# Patient Record
Sex: Male | Born: 1940 | Race: White | Hispanic: No | Marital: Married | Smoking: Never smoker
Health system: Southern US, Community
[De-identification: ages and names within clinical notes are randomized; demographics above are authoritative.]

## PROBLEM LIST (undated history)

## (undated) DIAGNOSIS — N183 Chronic kidney disease, stage 3 unspecified: Secondary | ICD-10-CM

## (undated) DIAGNOSIS — J45909 Unspecified asthma, uncomplicated: Secondary | ICD-10-CM

## (undated) DIAGNOSIS — R7303 Prediabetes: Secondary | ICD-10-CM

## (undated) DIAGNOSIS — I10 Essential (primary) hypertension: Secondary | ICD-10-CM

## (undated) DIAGNOSIS — E119 Type 2 diabetes mellitus without complications: Secondary | ICD-10-CM

## (undated) DIAGNOSIS — Z972 Presence of dental prosthetic device (complete) (partial): Secondary | ICD-10-CM

## (undated) HISTORY — PX: CORONARY ARTERY BYPASS GRAFT: SHX141

---

## 2005-03-17 ENCOUNTER — Ambulatory Visit: Payer: Self-pay | Admitting: Unknown Physician Specialty

## 2011-09-08 HISTORY — PX: CORONARY ARTERY BYPASS GRAFT: SHX141

## 2013-10-18 ENCOUNTER — Ambulatory Visit: Payer: Self-pay | Admitting: Unknown Physician Specialty

## 2013-10-19 LAB — PATHOLOGY REPORT

## 2015-05-30 ENCOUNTER — Other Ambulatory Visit: Payer: Self-pay | Admitting: Neurology

## 2015-05-30 DIAGNOSIS — R51 Headache: Principal | ICD-10-CM

## 2015-05-30 DIAGNOSIS — R519 Headache, unspecified: Secondary | ICD-10-CM

## 2015-06-05 ENCOUNTER — Ambulatory Visit
Admission: RE | Admit: 2015-06-05 | Discharge: 2015-06-05 | Disposition: A | Discharge status: Home / Self Care | Payer: Medicare Other | Source: Ambulatory Visit | Attending: Neurology | Admitting: Neurology

## 2015-06-05 DIAGNOSIS — S0990XA Unspecified injury of head, initial encounter: Secondary | ICD-10-CM | POA: Insufficient documentation

## 2015-06-05 DIAGNOSIS — J320 Chronic maxillary sinusitis: Secondary | ICD-10-CM | POA: Diagnosis not present

## 2015-06-05 DIAGNOSIS — R51 Headache: Secondary | ICD-10-CM

## 2015-06-05 DIAGNOSIS — R519 Headache, unspecified: Secondary | ICD-10-CM

## 2017-04-07 ENCOUNTER — Ambulatory Visit: Payer: Medicare Other

## 2017-04-07 ENCOUNTER — Ambulatory Visit
Admission: EM | Admit: 2017-04-07 | Discharge: 2017-04-07 | Disposition: A | Discharge status: Home / Self Care | Payer: Medicare Other | Attending: Family Medicine | Admitting: Family Medicine

## 2017-04-07 DIAGNOSIS — S5001XA Contusion of right elbow, initial encounter: Secondary | ICD-10-CM | POA: Insufficient documentation

## 2017-04-07 DIAGNOSIS — Y9301 Activity, walking, marching and hiking: Secondary | ICD-10-CM | POA: Insufficient documentation

## 2017-04-07 DIAGNOSIS — W19XXXA Unspecified fall, initial encounter: Secondary | ICD-10-CM | POA: Diagnosis not present

## 2017-04-07 DIAGNOSIS — M25521 Pain in right elbow: Secondary | ICD-10-CM | POA: Diagnosis present

## 2017-04-07 DIAGNOSIS — W010XXA Fall on same level from slipping, tripping and stumbling without subsequent striking against object, initial encounter: Secondary | ICD-10-CM | POA: Insufficient documentation

## 2017-04-07 NOTE — ED Triage Notes (Signed)
Patient complains of right elbow pain that occurred when [place]. Patient states that he fell with his arms straight out. Patient has swelling and bruising on his arm.

## 2017-04-07 NOTE — ED Provider Notes (Signed)
CSN: [id number]     Arrival date & time 04/07/17  1413 History   First MD Initiated Contact with Patient 04/07/17 1454     Chief Complaint  Patient presents with  . Fall  . Elbow Pain    right   (Consider location/radiation/quality/duration/timing/severity/associated sxs/prior Treatment) HPI  This a 76 year old male who states today around 11:00 he was walking when he tripped over a ladder leaning forward. He states he was holding a gallon jug in his left hand and fell full force onto an outstretched right arm. He fell onto ground. He had pain in his elbow. He had no numbness or tingling distally. He states that they have hours with full flexion and also with pronation.       History reviewed. No pertinent past medical history. History reviewed. No pertinent surgical history. History reviewed. No pertinent family history. Social History  Substance Use Topics  . Smoking status: Never Smoker  . Smokeless tobacco: Never Used  . Alcohol use No    Review of Systems  Constitutional: Positive for activity change. Negative for chills, fatigue and fever.  Musculoskeletal: Positive for arthralgias and joint swelling.    Allergies  Patient has no known allergies.  Home Medications   Prior to Admission medications   Medication Sig Start Date End Date Taking? Authorizing Provider  aspirin EC 81 MG tablet Take 81 mg by mouth daily.   Yes [provider]  atorvastatin (LIPITOR) 20 MG tablet Take 20 mg by mouth daily.   Yes [provider]  cholecalciferol (VITAMIN D) 1000 units tablet Take 1,000 Units by mouth daily.   Yes [provider]  ipratropium-albuterol (DUONEB) 0.5-2.5 (3) MG/3ML SOLN Take 3 mLs by nebulization.   Yes [provider]  lisinopril (PRINIVIL,ZESTRIL) 20 MG tablet Take 20 mg by mouth daily.   Yes [provider]  metFORMIN (GLUCOPHAGE) 500 MG tablet Take by mouth 2 (two) times daily with a meal.   Yes [provider]  metoprolol succinate (TOPROL-XL) 50 MG 24 hr tablet Take 50 mg by mouth daily. Take with or immediately following a meal.   Yes [provider]  nitroGLYCERIN (NITROSTAT) 0.4 MG SL tablet Place 0.4 mg under the tongue every 5 (five) minutes as needed for chest pain.   Yes [provider]  Omega-3 Fatty Acids (FISH OIL) 1000 MG CAPS Take by mouth.   Yes [provider]  vitamin B-12 (CYANOCOBALAMIN) 1000 MCG tablet Take 1,000 mcg by mouth daily.   Yes [provider]   Meds Ordered and Administered this Visit  Medications - No data to display  BP (!) 105/50 (BP Location: Left Arm)   Pulse (!) 55   Temp 97.9 F (36.6 C) (Oral)   Resp 18   Ht 5\' 10"  (1.778 m)   Wt 205 lb (93 kg)   SpO2 99%   BMI 29.41 kg/m  No data found.   Physical Exam  Constitutional: He appears well-developed and well-nourished. No distress.  HENT:  Head: Normocephalic and atraumatic.  Eyes: Pupils are equal, round, and reactive to light.  Neck: Normal range of motion.  Musculoskeletal:  Examination of the right elbow shows ecchymosis and swelling just proximal to the elbow. There is tenderness to palpation of the distal humerus as well as the radial head. Sensation is intact to light touch distally. Muscle strength is intact.  Skin: He is not diaphoretic.  Nursing note and vitals reviewed.   Urgent Care Course  Procedures (including critical care time)  Labs Review Labs Reviewed - No data to display  Imaging Review Dg Elbow Complete Right  Result Date: 04/07/2017 CLINICAL DATA:  Status post fall with medial pain. EXAM: RIGHT ELBOW - COMPLETE 3+ VIEW COMPARISON:  None. FINDINGS: There is no evidence of fracture, dislocation, or joint effusion. There is no evidence of arthropathy or other focal bone abnormality. Olecranon enthesophyte. Productive changes about the elbow joint with small periarticular osteophytes. IMPRESSION: No acute fracture or  dislocation identified. No appreciable joint effusion. Electronically Signed   By: Mitzi HansenLance  Furusawa-Stratton M.D.   On: 04/07/2017 15:14     Visual Acuity Review  Right Eye Distance:   Left Eye Distance:   Bilateral Distance:    Right Eye Near:   Left Eye Near:    Bilateral Near:         MDM   1. Fall, initial encounter   2. Contusion of right elbow, initial encounter   Possible occult impacted radial head fracture New Prescriptions   No medications on file  Plan: 1. Test/x-ray results and diagnosis reviewed with patient 2. rx as per orders; risks, benefits, potential side effects reviewed with patient 3. Recommend supportive treatment with Elevation above heart most today and tomorrow. Use sling for comfort. Several times daily put arm through range of motion exercises. If not improving [facility] [place] [place]. 4. F/u prn if symptoms worsen or don't improve     Lutricia FeilRoemer, Aviva Wolfer P, PA-C 04/07/17 1551

## 2017-04-07 NOTE — Discharge Instructions (Signed)
Elevate your arm above your heart most of today and tomorrow. Apply ice to your elbow 20 minutes out of every 2 hours 4 times a day control swelling. Usually her sling for comfort. Several times daily take her arm out of the sling and use your arm through range of motion exercises. Follow-up with [facility] if you are not improving in a week.

## 2017-09-08 ENCOUNTER — Ambulatory Visit
Admission: EM | Admit: 2017-09-08 | Discharge: 2017-09-08 | Disposition: A | Discharge status: Home / Self Care | Payer: Medicare Other | Attending: Family Medicine | Admitting: Family Medicine

## 2017-09-08 DIAGNOSIS — T148XXA Other injury of unspecified body region, initial encounter: Secondary | ICD-10-CM | POA: Diagnosis not present

## 2017-09-08 NOTE — ED Triage Notes (Signed)
Patient reports that he had an ear biopsy done around 2-3 weeks ago by Dr. Cheree DittoGraham. Patient states that the area started bleeding around 30 minutes prior to arrival. Patient bleeding significantly with clots on clothes.

## 2017-09-08 NOTE — Discharge Instructions (Signed)
Follow up with Dermatology.  Leave dressing on for 24 hours.   Take care  Dr. Adriana Simasook

## 2017-09-08 NOTE — ED Provider Notes (Addendum)
[facility]    CSN: [id number] Arrival date & time: 09/08/17  1857  History   Chief Complaint Chief Complaint  Patient presents with  . Bleeding/Bruising    HPI  76 year old male presents urgently for a bleeding wound.  Patient reports that approximately 2 weeks ago he had a skin lesion removed by dermatology.  He was out today and suddenly started bleeding from the wound.  It is located behind the right ear.  He reports profuse bleeding.  He applied pressure and came in urgently for evaluation.  He reports that he takes aspirin on a daily basis due to coronary disease.  He has had no complications since his skin lesion removal.  He does wear glasses and this may be the inciting factor.  No other associated symptoms.  No other complaints or concerns at this time.  Home Medications    Prior to Admission medications   Medication Sig Start Date End Date Taking? Authorizing Provider  aspirin EC 81 MG tablet Take 81 mg by mouth daily.   Yes [provider]  atorvastatin (LIPITOR) 20 MG tablet Take 20 mg by mouth daily.   Yes [provider]  cholecalciferol (VITAMIN D) 1000 units tablet Take 1,000 Units by mouth daily.   Yes [provider]  ipratropium-albuterol (DUONEB) 0.5-2.5 (3) MG/3ML SOLN Take 3 mLs by nebulization.   Yes [provider]  lisinopril (PRINIVIL,ZESTRIL) 20 MG tablet Take 20 mg by mouth daily.   Yes [provider]  metFORMIN (GLUCOPHAGE) 500 MG tablet Take by mouth 2 (two) times daily with a meal.   Yes [provider]  metoprolol succinate (TOPROL-XL) 50 MG 24 hr tablet Take 50 mg by mouth daily. Take with or immediately following a meal.   Yes [provider]  nitroGLYCERIN (NITROSTAT) 0.4 MG SL tablet Place 0.4 mg under the tongue every 5 (five) minutes as needed for chest pain.   Yes [provider]  Omega-3 Fatty Acids (FISH OIL) 1000 MG CAPS Take by mouth.   Yes [provider]  vitamin B-12 (CYANOCOBALAMIN) 1000 MCG tablet Take 1,000 mcg by mouth daily.   Yes [provider]   Allergies   Patient has no known allergies.   Review of Systems Review of Systems  Constitutional: Negative.   Skin: Positive for wound.       Bleeding.   Physical Exam Triage Vital Signs ED Triage Vitals  Enc Vitals Group     BP 09/08/17 1918 (!) 144/75     Pulse Rate 09/08/17 1918 72     Resp 09/08/17 1918 17     Temp 09/08/17 1918 98.4 F (36.9 C)     Temp Source 09/08/17 1918 Oral     SpO2 09/08/17 1918 99 %     Weight 09/08/17 1913 197 lb (89.4 kg)     Height --      Head Circumference --      Peak Flow --      Pain Score 09/08/17 1913 0     Pain Loc --      Pain Edu? --      Excl. in GC? --    Updated Vital Signs BP (!) 144/75 (BP Location: Left Arm)   Pulse 72   Temp 98.4 F (36.9 C) (Oral)   Resp 17   Wt 197 lb (89.4 kg)   SpO2 99%   BMI 28.27 kg/m   Physical Exam  Constitutional: He is oriented to person,  place, and time. He appears well-developed. No distress.  Covered in blood.  HENT:  Head: Normocephalic and atraumatic.  Eyes: Conjunctivae are normal.  Pulmonary/Chest: Effort normal. No respiratory distress.  Neurological: He is alert and oriented to person, place, and time.  Skin:  Posterior auricular region - circular open wound from recent lesion removal.  Mild oozing noted after area was cleaned.  Psychiatric: He has a normal mood and affect.  Vitals reviewed.  UC Treatments / Results  Labs (all labs ordered are listed, but only abnormal results are displayed) Labs Reviewed - No data to display  EKG  EKG Interpretation None       Radiology No results found.  Procedures Procedures (including critical care time)\ Area cleansed with alcohol and anesthetized with 1% lidocaine without epinephrine (~ 2 mL).  Cautery was used to stop bleeding.  Dressing was then applied.  Patient tolerated procedure without  difficulty.  Medications Ordered in UC Medications - No data to display   Initial Impression / Assessment and Plan / UC Course  I have reviewed the triage vital signs and the nursing notes.  Pertinent labs & imaging results that were available during my care of the patient were reviewed by me and considered in my medical decision making (see chart for details).    76 year old male presents with a bleeding wound.  Bleeding was controlled with pressure.  Wound cleaned and explored.  Oozing noted from the wound.  Wound anesthetized with lidocaine and was cauterized.  Dressing then applied.  Advised the patient to leave the dressing on for 24 hours and then he can remove.  Family member to change dressings.  Follow-up with dermatology.  Final Clinical Impressions(s) / UC Diagnoses   Final diagnoses:  Bleeding from wound   New Prescriptions Discharge Medication List as of 09/08/2017  7:23 PM     Controlled Substance Prescriptions  Controlled Substance Registry consulted? NoTommie Sams.   Dima Mini G, DO 09/08/17 2012    Everlene Otherook, Rajat Staver G, DO 09/08/17 2015

## 2018-02-26 IMAGING — CR DG ELBOW COMPLETE 3+V*R*
6 series · 6 of 6 positions shown · non-contrast
Comparison: None.

CLINICAL DATA: Status post fall with medial pain.

EXAM:
RIGHT ELBOW - COMPLETE 3+ VIEW

[elbow ap]
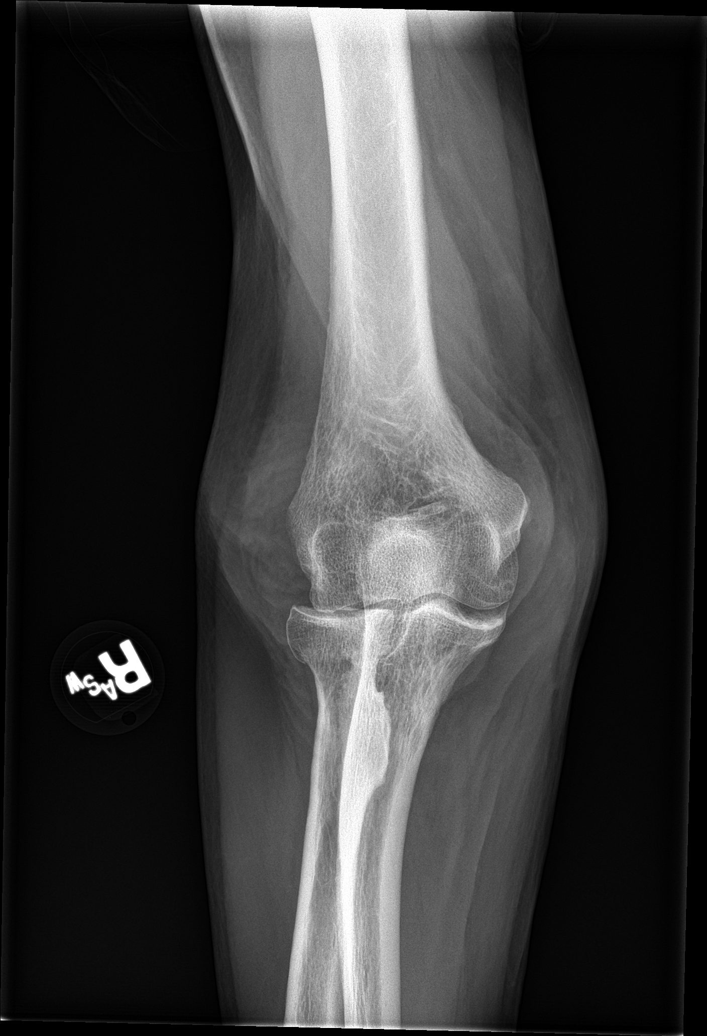

[elbow obl (1 of 4)]
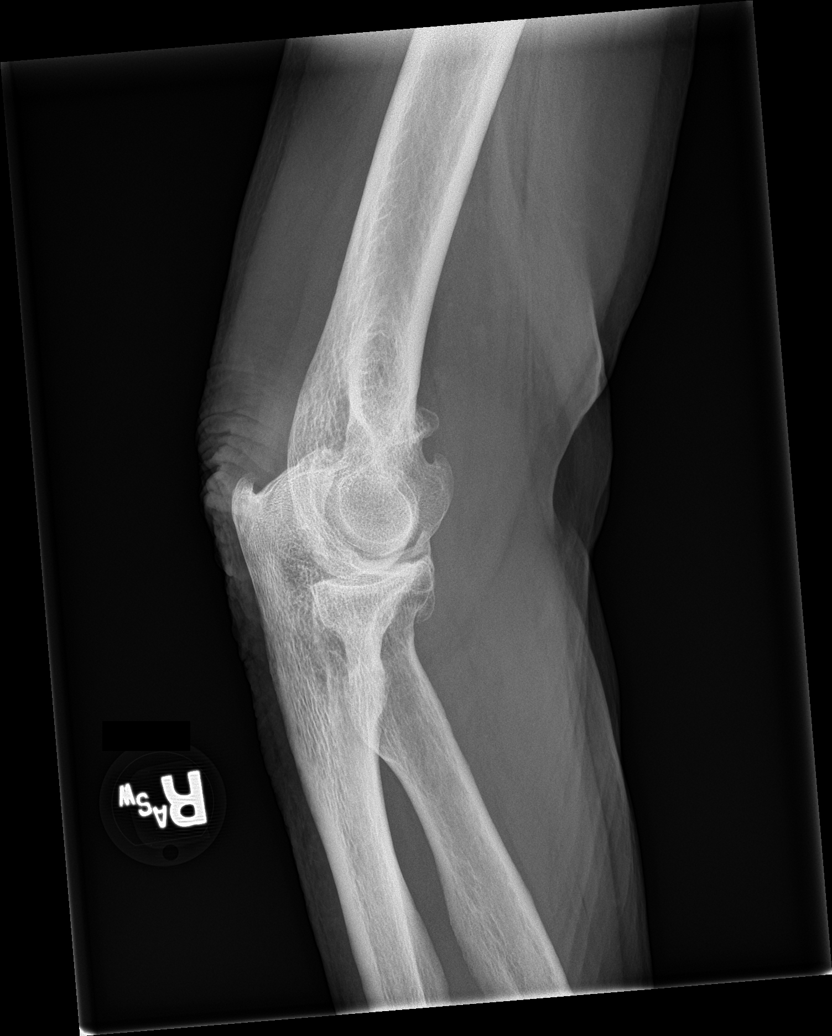

[elbow obl (2 of 4)]
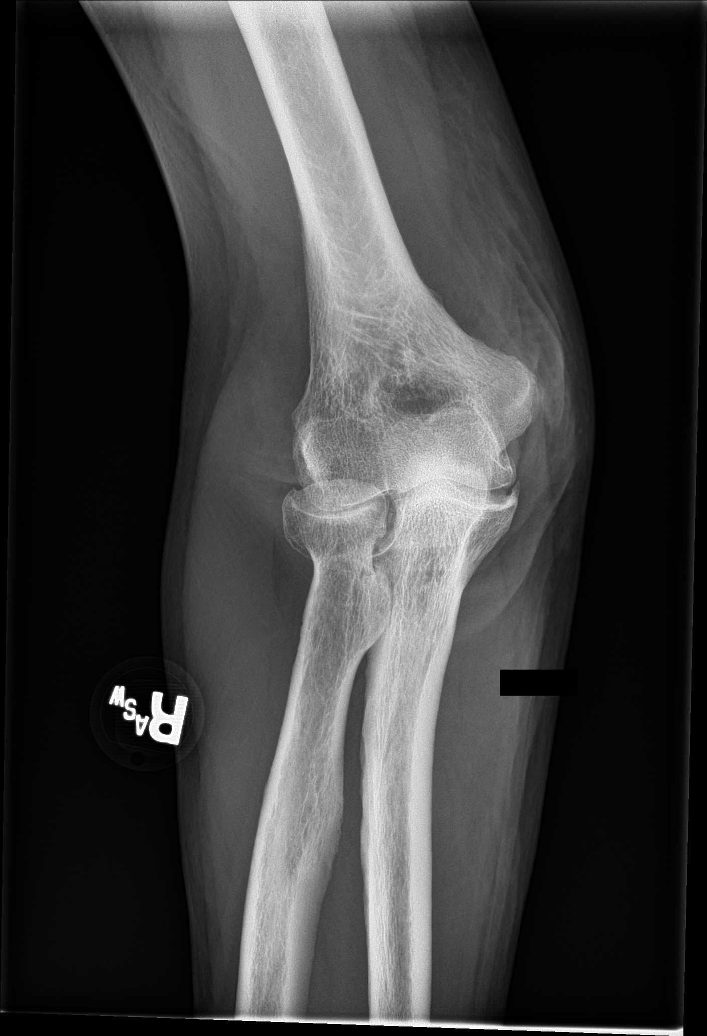

[elbow lat]
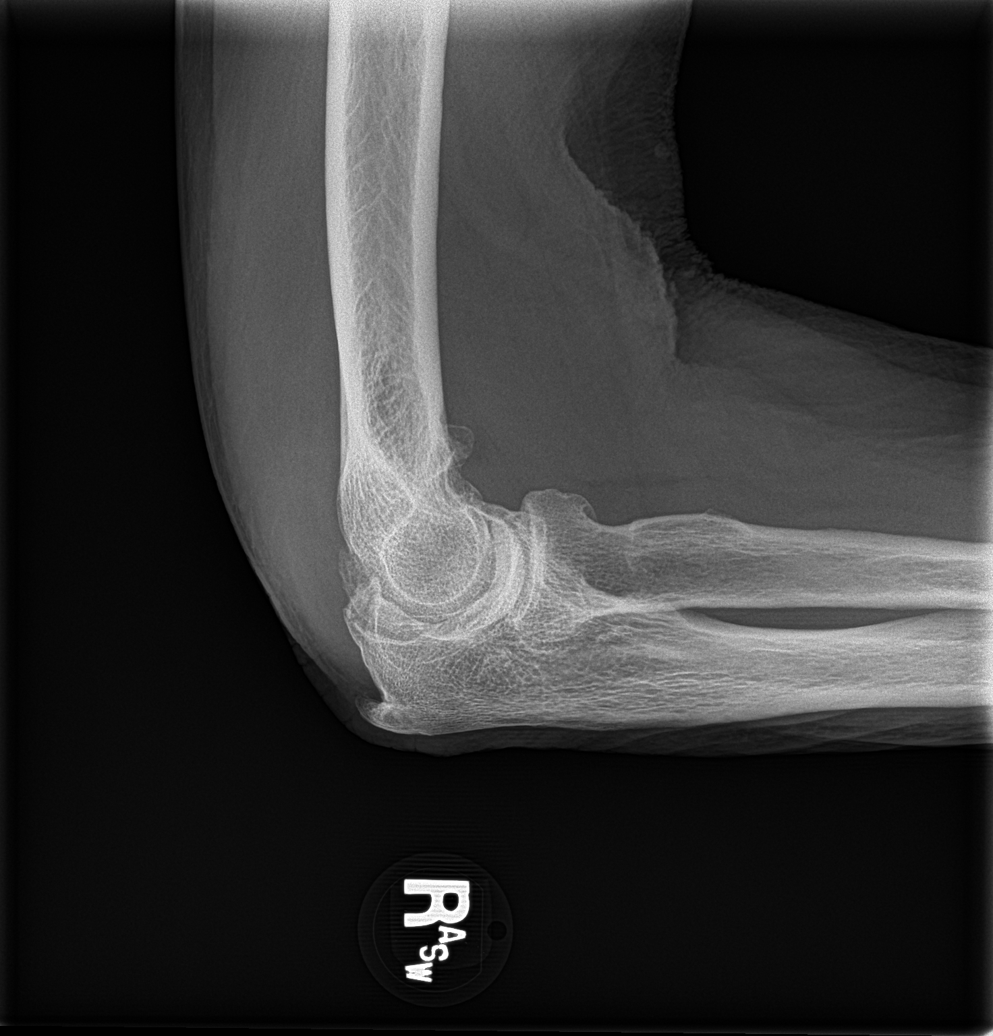

[elbow obl (3 of 4)]
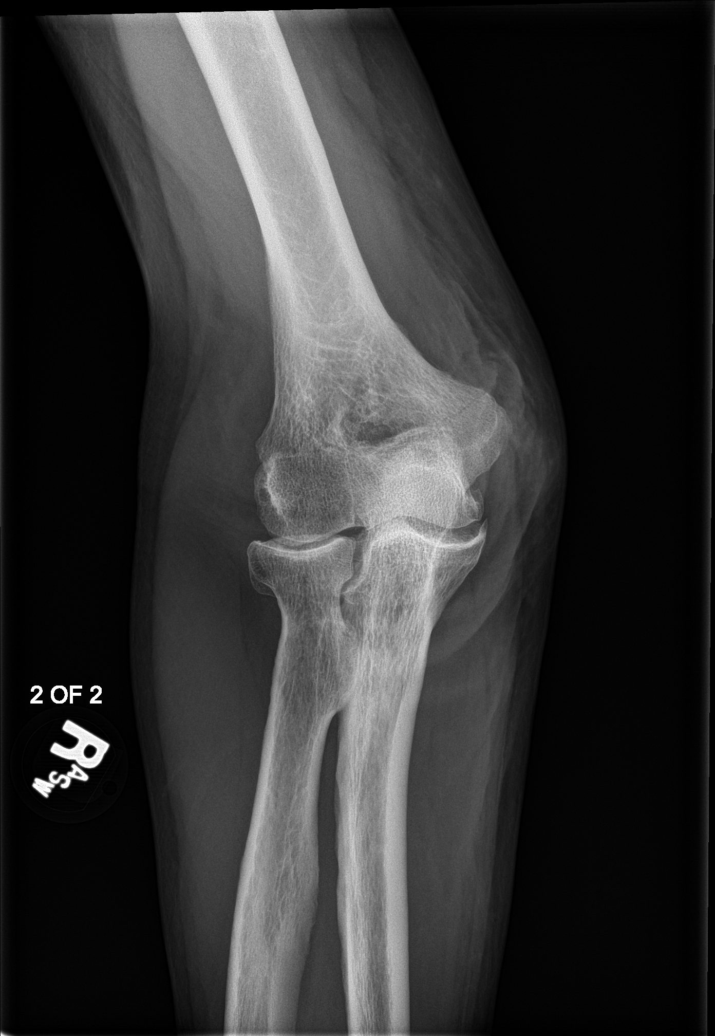

[elbow obl (4 of 4)]
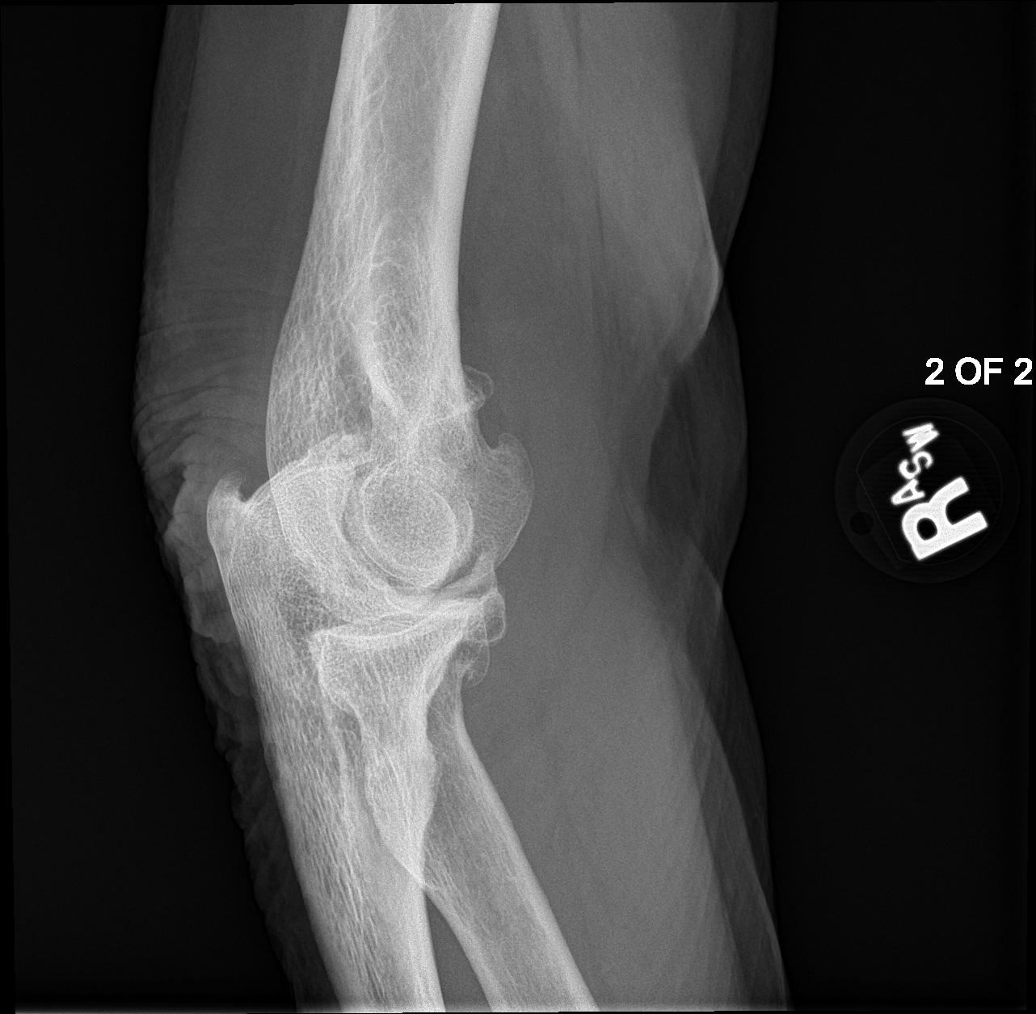

[6 of 6 positions shown; findings below may reference images not displayed]

FINDINGS: There is no evidence of fracture, dislocation, or joint effusion.
There is no evidence of arthropathy or other focal bone abnormality.
Olecranon enthesophyte. Productive changes about the elbow joint
with small periarticular osteophytes.
IMPRESSION: No acute fracture or dislocation identified. No appreciable joint
effusion.

By: Autoshkolle Bardha M.D.

## 2021-01-21 ENCOUNTER — Encounter: Payer: Self-pay | Admitting: Ophthalmology

## 2021-01-21 ENCOUNTER — Other Ambulatory Visit: Payer: Self-pay

## 2021-01-24 ENCOUNTER — Other Ambulatory Visit
Admission: RE | Admit: 2021-01-24 | Discharge: 2021-01-24 | Disposition: A | Discharge status: Home / Self Care | Payer: Medicare Other | Source: Ambulatory Visit | Attending: Ophthalmology | Admitting: Ophthalmology

## 2021-01-24 ENCOUNTER — Other Ambulatory Visit: Payer: Self-pay

## 2021-01-24 DIAGNOSIS — Z20822 Contact with and (suspected) exposure to covid-19: Secondary | ICD-10-CM | POA: Diagnosis not present

## 2021-01-24 DIAGNOSIS — Z01812 Encounter for preprocedural laboratory examination: Secondary | ICD-10-CM | POA: Diagnosis present

## 2021-01-24 LAB — SARS CORONAVIRUS 2 (TAT 6-24 HRS): SARS Coronavirus 2: NEGATIVE

## 2021-01-24 NOTE — Discharge Instructions (Signed)

## 2021-01-28 ENCOUNTER — Ambulatory Visit: Payer: Medicare Other | Admitting: Anesthesiology

## 2021-01-28 ENCOUNTER — Other Ambulatory Visit: Payer: Self-pay

## 2021-01-28 ENCOUNTER — Encounter: Payer: Self-pay | Admitting: Ophthalmology

## 2021-01-28 ENCOUNTER — Encounter
Admission: RE | Disposition: A | Discharge status: Home / Self Care | Payer: Self-pay | Source: Home / Self Care | Attending: Ophthalmology

## 2021-01-28 ENCOUNTER — Ambulatory Visit
Admission: RE | Admit: 2021-01-28 | Discharge: 2021-01-28 | Disposition: A | Discharge status: Home / Self Care | Payer: Medicare Other | Attending: Ophthalmology | Admitting: Ophthalmology

## 2021-01-28 DIAGNOSIS — H2511 Age-related nuclear cataract, right eye: Secondary | ICD-10-CM | POA: Insufficient documentation

## 2021-01-28 DIAGNOSIS — Z7982 Long term (current) use of aspirin: Secondary | ICD-10-CM | POA: Diagnosis not present

## 2021-01-28 DIAGNOSIS — Z7984 Long term (current) use of oral hypoglycemic drugs: Secondary | ICD-10-CM | POA: Insufficient documentation

## 2021-01-28 DIAGNOSIS — Z951 Presence of aortocoronary bypass graft: Secondary | ICD-10-CM | POA: Diagnosis not present

## 2021-01-28 DIAGNOSIS — Z79899 Other long term (current) drug therapy: Secondary | ICD-10-CM | POA: Insufficient documentation

## 2021-01-28 DIAGNOSIS — E1136 Type 2 diabetes mellitus with diabetic cataract: Secondary | ICD-10-CM | POA: Insufficient documentation

## 2021-01-28 DIAGNOSIS — Z791 Long term (current) use of non-steroidal anti-inflammatories (NSAID): Secondary | ICD-10-CM | POA: Diagnosis not present

## 2021-01-28 HISTORY — PX: CATARACT EXTRACTION W/PHACO: SHX586

## 2021-01-28 HISTORY — DX: Essential (primary) hypertension: I10

## 2021-01-28 HISTORY — DX: Prediabetes: R73.03

## 2021-01-28 HISTORY — DX: Unspecified asthma, uncomplicated: J45.909

## 2021-01-28 HISTORY — DX: Presence of dental prosthetic device (complete) (partial): Z97.2

## 2021-01-28 HISTORY — DX: Chronic kidney disease, stage 3 unspecified: N18.30

## 2021-01-28 HISTORY — DX: Type 2 diabetes mellitus without complications: E11.9

## 2021-01-28 LAB — GLUCOSE, CAPILLARY
Glucose-Capillary: 99 mg/dL (ref 70–99)
Glucose-Capillary: 102 mg/dL — ABNORMAL HIGH (ref 70–99)

## 2021-01-28 SURGERY — PHACOEMULSIFICATION, CATARACT, WITH IOL INSERTION
Anesthesia: Monitor Anesthesia Care | Site: Eye | Laterality: Right

## 2021-01-28 MED ORDER — ARMC OPHTHALMIC DILATING DROPS
1.0000 "application " | OPHTHALMIC | Status: DC | PRN
  Administered 2021-01-28 (×3): 1 via OPHTHALMIC

## 2021-01-28 MED ORDER — ONDANSETRON HCL 4 MG/2ML IJ SOLN: 4.0000 mg | Freq: Once | INTRAMUSCULAR | Status: DC | PRN

## 2021-01-28 MED ORDER — ACETAMINOPHEN 325 MG PO TABS: 325.0000 mg | ORAL_TABLET | ORAL | Status: DC | PRN

## 2021-01-28 MED ORDER — FENTANYL CITRATE (PF) 100 MCG/2ML IJ SOLN
INTRAMUSCULAR | Status: DC | PRN
  Administered 2021-01-28: 50 ug via INTRAVENOUS

## 2021-01-28 MED ORDER — MIDAZOLAM HCL 2 MG/2ML IJ SOLN
INTRAMUSCULAR | Status: DC | PRN
  Administered 2021-01-28: 1 mg via INTRAVENOUS

## 2021-01-28 MED ORDER — MOXIFLOXACIN HCL 0.5 % OP SOLN
OPHTHALMIC | Status: DC | PRN
  Administered 2021-01-28: 0.2 mL via OPHTHALMIC

## 2021-01-28 MED ORDER — SODIUM HYALURONATE 10 MG/ML IO SOLN
INTRAOCULAR | Status: DC | PRN
  Administered 2021-01-28: 0.55 mL via INTRAOCULAR

## 2021-01-28 MED ORDER — ACETAMINOPHEN 160 MG/5ML PO SOLN: 325.0000 mg | ORAL | Status: DC | PRN

## 2021-01-28 MED ORDER — EPINEPHRINE PF 1 MG/ML IJ SOLN
INTRAOCULAR | Status: DC | PRN
  Administered 2021-01-28: 66 mL via OPHTHALMIC

## 2021-01-28 MED ORDER — TETRACAINE HCL 0.5 % OP SOLN
1.0000 [drp] | OPHTHALMIC | Status: DC | PRN
  Administered 2021-01-28 (×3): 1 [drp] via OPHTHALMIC

## 2021-01-28 MED ORDER — SODIUM HYALURONATE 23 MG/ML IO SOLN
INTRAOCULAR | Status: DC | PRN
  Administered 2021-01-28: 0.6 mL via INTRAOCULAR

## 2021-01-28 MED ORDER — LIDOCAINE HCL (PF) 2 % IJ SOLN
INTRAOCULAR | Status: DC | PRN
  Administered 2021-01-28: 1 mL via INTRAOCULAR

## 2021-01-28 SURGICAL SUPPLY — 19 items
CANNULA ANT/CHMB 27G (MISCELLANEOUS) ×2 IMPLANT
CANNULA ANT/CHMB 27GA (MISCELLANEOUS) ×4 IMPLANT
DISSECTOR HYDRO NUCLEUS 50X22 (MISCELLANEOUS) ×2 IMPLANT
GLOVE SURG SYN 8.5  E (GLOVE) ×1
GLOVE SURG SYN 8.5 E (GLOVE) ×1 IMPLANT
GLOVE SURG SYN 8.5 PF PI (GLOVE) ×1 IMPLANT
GOWN STRL REUS W/ TWL LRG LVL3 (GOWN DISPOSABLE) ×2 IMPLANT
GOWN STRL REUS W/TWL LRG LVL3 (GOWN DISPOSABLE) ×4
LENS IOL IQ PAN TRC 30 25.0 IMPLANT
LENS IOL PANOP TORIC 30 25.0 ×1 IMPLANT
LENS IOL PANOPTIX TORIC 25.0 ×2 IMPLANT
MARKER SKIN DUAL TIP RULER LAB (MISCELLANEOUS) ×2 IMPLANT
PACK DR. KING ARMS (PACKS) ×2 IMPLANT
PACK EYE AFTER SURG (MISCELLANEOUS) ×2 IMPLANT
PACK OPTHALMIC (MISCELLANEOUS) ×2 IMPLANT
SYR 3ML LL SCALE MARK (SYRINGE) ×2 IMPLANT
SYR TB 1ML LUER SLIP (SYRINGE) ×2 IMPLANT
WATER STERILE IRR 250ML POUR (IV SOLUTION) ×2 IMPLANT
WIPE NON LINTING 3.25X3.25 (MISCELLANEOUS) ×2 IMPLANT

## 2021-01-28 NOTE — H&P (Signed)
Larry Ware   Primary Care Physician:  Dione Housekeeper, MD Ophthalmologist: Dr. Willey Blade  Pre-Procedure History & Physical: HPI:  Larry Ware is a 80 y.o. male here for cataract surgery.   Past Medical History:  Diagnosis Date  . Asthma   . 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 Diabetes type 2, controlled (HCC)   . Hypertension   . Pre-diabetes   . Wears dentures    partial lower    Past Surgical History:  Procedure Laterality Date  . 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 Authorizing Provider  albuterol (VENTOLIN HFA) 108 (90 Base) MCG/ACT inhaler Inhale into the lungs every 6 (six) hours as needed for wheezing or shortness of breath.   Yes [provider]  aspirin EC 81 MG tablet Take 81 mg by mouth daily.   Yes [provider]  atorvastatin (LIPITOR) 20 MG tablet Take 20 mg by mouth daily.   Yes [provider]  celecoxib (CELEBREX) 100 MG capsule Take 100 mg by mouth daily.   Yes [provider]  cholecalciferol (VITAMIN D) 1000 units tablet Take 1,000 Units by mouth daily.   Yes [provider]  ipratropium-albuterol (DUONEB) 0.5-2.5 (3) MG/3ML SOLN Take 3 mLs by nebulization.   Yes [provider]  isosorbide mononitrate (IMDUR) 30 MG 24 hr tablet Take 30 mg by mouth daily.   Yes [provider]  lisinopril (PRINIVIL,ZESTRIL) 20 MG tablet Take 20 mg by mouth daily.   Yes [provider]  metFORMIN (GLUCOPHAGE) 500 MG tablet Take by mouth 2 (two) times daily with a meal.   Yes [provider]  metoprolol succinate (TOPROL-XL) 50 MG 24 hr tablet Take 25 mg by mouth in the morning and at bedtime. Take with or immediately following a meal.   Yes [provider]  nitroGLYCERIN (NITROSTAT) 0.4 MG SL tablet Place 0.4 mg under the tongue every 5  (five) minutes as needed for chest pain.   Yes [provider]  Omega-3 Fatty Acids (FISH OIL) 1000 MG CAPS Take by mouth.   Yes [provider]  POTASSIUM PO Take by mouth daily.   Yes [provider]  vitamin B-12 (CYANOCOBALAMIN) 1000 MCG tablet Take 1,000 mcg by mouth daily.   Yes [provider]  vitamin C (ASCORBIC ACID) 500 MG tablet Take 500 mg by mouth daily.   Yes [provider]  oxyCODONE (OXY IR/ROXICODONE) 5 MG immediate release tablet Take 5 mg by mouth every 4 (four) hours as needed for severe pain.    [provider]    Allergies as of 12/12/2020  . (No Known Allergies)    History reviewed. No pertinent family history.  Social History   Socioeconomic History  . Marital status: Married    Spouse name: Not on file  . Number of children: Not on file  . Years of education: Not on file  . Highest education level: Not on file  Occupational History  . Not on file  Tobacco Use  . Smoking status: Never Smoker  . Smokeless tobacco: Never Used  Vaping Use  . Vaping Use: Never used  Substance and Sexual Activity  . Alcohol use: No  . Drug use: No  . Sexual activity: Not on file  Other Topics Concern  . Not on file  Social History Narrative  . Not on file   Social Determinants of Health   Financial Resource Strain: Not on file  Food Insecurity: Not on file  Transportation Needs: Not on file  Physical Activity: Not on file  Stress: Not on file  Social Connections: Not on file  Intimate Partner Violence: Not on file    Review of Systems: See HPI, otherwise negative ROS  Physical Exam: BP (!) 174/61   Pulse (!) 55   Temp (!) 97.5 F (36.4 C) (Temporal)   Ht 5\' 9"  (1.753 m)   Wt 89.8 kg   SpO2 100%   BMI 29.24 kg/m  General:   Alert,  pleasant and cooperative in NAD Head:  Normocephalic and atraumatic. Respiratory:  Normal work of breathing.  Impression/Plan: is here for cataract  surgery.  Risks, benefits, limitations, and alternatives regarding cataract surgery have been reviewed with the patient.  Questions have been answered.  All parties agreeable.   [name], MD  01/28/2021, 10:54 AM

## 2021-01-28 NOTE — Anesthesia Preprocedure Evaluation (Signed)
Anesthesia Evaluation  Patient identified by MRN, date of birth, ID band Patient awake    Reviewed: Allergy & Precautions, NPO status   Airway Mallampati: II  TM Distance: >3 FB     Dental   Pulmonary asthma ,    breath sounds clear to auscultation       Cardiovascular hypertension, + CABG   Rhythm:Regular Rate:Normal  HLD   Neuro/Psych    GI/Hepatic   Endo/Other  diabetes, Type 2, Oral Hypoglycemic Agents  Renal/GU Renal disease (CKD3)     Musculoskeletal   Abdominal   Peds  Hematology   Anesthesia Other Findings   Reproductive/Obstetrics                             Anesthesia Physical Anesthesia Plan  ASA: III  Anesthesia Plan: MAC   Post-op Pain Management:    Induction: Intravenous  PONV Risk Score and Plan: TIVA, Midazolam and Treatment may vary due to age or medical condition  Airway Management Planned: Natural Airway and Nasal Cannula  Additional Equipment:   Intra-op Plan:   Post-operative Plan:   Informed Consent: I have reviewed the patients History and Physical, chart, labs and discussed the procedure including the risks, benefits and alternatives for the proposed anesthesia with the patient or authorized representative who has indicated his/her understanding and acceptance.       Plan Discussed with: CRNA  Anesthesia Plan Comments:         Anesthesia Quick Evaluation

## 2021-01-28 NOTE — Anesthesia Procedure Notes (Signed)
Procedure Name: MAC Date/Time: 01/28/2021 11:06 AM Performed by: Cameron Ali, CRNA Pre-anesthesia Checklist: Patient identified, Emergency Drugs available, Suction available, Timeout performed and Patient being monitored Patient Re-evaluated:Patient Re-evaluated prior to induction Oxygen Delivery Method: Nasal cannula Placement Confirmation: positive ETCO2

## 2021-01-28 NOTE — Transfer of Care (Signed)
Immediate Anesthesia Transfer of Care Note  Patient: Larry Ware  Procedure(s) Performed: CATARACT EXTRACTION PHACO AND INTRAOCULAR LENS PLACEMENT (IOC) RIGHT DIABETIC PANOPTIX LENS 4.48 00:30.9 (Right Eye)  Patient Location: PACU  Anesthesia Type: MAC  Level of Consciousness: awake, alert  and patient cooperative  Airway and Oxygen Therapy: Patient Spontanous Breathing and Patient connected to supplemental oxygen  Post-op Assessment: Post-op Vital signs reviewed, Patient's Cardiovascular Status Stable, Respiratory Function Stable, Patent Airway and No signs of Nausea or vomiting  Post-op Vital Signs: Reviewed and stable  Complications: No complications documented.

## 2021-01-28 NOTE — Anesthesia Postprocedure Evaluation (Signed)
Anesthesia Post Note  Patient: Larry Ware  Procedure(s) Performed: CATARACT EXTRACTION PHACO AND INTRAOCULAR LENS PLACEMENT (IOC) RIGHT DIABETIC PANOPTIX LENS 4.48 00:30.9 (Right Eye)     Patient location during evaluation: PACU Anesthesia Type: MAC Level of consciousness: awake Pain management: pain level controlled Vital Signs Assessment: post-procedure vital signs reviewed and stable Respiratory status: respiratory function stable Cardiovascular status: stable Postop Assessment: no apparent nausea or vomiting Anesthetic complications: no   No complications documented.  Veda Canning

## 2021-01-28 NOTE — Op Note (Signed)
OPERATIVE NOTE  Larry Ware [id number] 01/28/2021   PREOPERATIVE DIAGNOSIS:  Nuclear sclerotic cataract right eye.  H25.11   POSTOPERATIVE DIAGNOSIS:    Nuclear sclerotic cataract right eye.     PROCEDURE:  Phacoemusification with posterior chamber intraocular lens placement of the right eye   LENS:   Implant Name Type Inv. Item Serial No. Manufacturer Lot No. LRB No. Used Action  LENS IOL PANOPTIX TORIC 25.0 - [id number]  LENS IOL PANOPTIX TORIC 25.0 [id number] ALCON  Right 1 Implanted       Procedure(s): CATARACT EXTRACTION PHACO AND INTRAOCULAR LENS PLACEMENT (IOC) RIGHT DIABETIC PANOPTIX LENS 4.48 00:30.9 (Right)  TFNT30 +25.0   ULTRASOUND TIME: 0 minutes 30 seconds.  CDE 4.48   SURGEON:  Willey Blade, MD, MPH  ANESTHESIOLOGIST: Anesthesiologist: Jola Babinski, MD CRNA: Maree Krabbe, CRNA   ANESTHESIA:  Topical with tetracaine drops augmented with 1% preservative-free intracameral lidocaine.  ESTIMATED BLOOD LOSS: less than 1 mL.   COMPLICATIONS:  None.   DESCRIPTION OF PROCEDURE:  The patient was identified in the holding room and transported to the operating room and placed in the supine position under the operating microscope.  The right eye was identified as the operative eye and it was prepped and draped in the usual sterile ophthalmic fashion.  The verion was registered without difficulty.   A 1.0 millimeter clear-corneal paracentesis was made at the 10:30 position. 0.5 ml of preservative-free 1% lidocaine with epinephrine was injected into the anterior chamber.  The anterior chamber was filled with Healon 5 viscoelastic.  A 2.4 millimeter keratome was used to make a near-clear corneal incision at the 8:00 position.  A curvilinear capsulorrhexis was made with a cystotome and capsulorrhexis forceps.  Balanced salt solution was used to hydrodissect and hydrodelineate the nucleus.   Phacoemulsification was then used in stop and chop fashion to remove the lens  nucleus and epinucleus.  The remaining cortex was then removed using the irrigation and aspiration handpiece. Healon was then placed into the capsular bag to distend it for lens placement.  A lens was then injected into the capsular bag.  The remaining viscoelastic was aspirated.  The lens was rotated to 021 with assistance for alignment from the verion.  The lens was well centered and aligned.   Wounds were hydrated with balanced salt solution.  The anterior chamber was inflated to a physiologic pressure with balanced salt solution.   Intracameral vigamox 0.1 mL undiluted was injected into the eye and a drop placed onto the ocular surface.  No wound leaks were noted.  The patient was taken to the recovery room in stable condition without complications of anesthesia or surgery  Willey Blade 01/28/2021, 11:25 AM

## 2021-01-30 ENCOUNTER — Encounter: Payer: Self-pay | Admitting: Ophthalmology

## 2021-02-04 ENCOUNTER — Encounter: Payer: Self-pay | Admitting: Ophthalmology

## 2021-02-07 ENCOUNTER — Other Ambulatory Visit: Payer: Self-pay

## 2021-02-07 ENCOUNTER — Other Ambulatory Visit
Admission: RE | Admit: 2021-02-07 | Discharge: 2021-02-07 | Disposition: A | Discharge status: Home / Self Care | Payer: Medicare Other | Source: Ambulatory Visit | Attending: Ophthalmology | Admitting: Ophthalmology

## 2021-02-07 DIAGNOSIS — Z20822 Contact with and (suspected) exposure to covid-19: Secondary | ICD-10-CM | POA: Insufficient documentation

## 2021-02-07 DIAGNOSIS — Z01812 Encounter for preprocedural laboratory examination: Secondary | ICD-10-CM | POA: Diagnosis present

## 2021-02-07 LAB — SARS CORONAVIRUS 2 (TAT 6-24 HRS): SARS Coronavirus 2: NEGATIVE

## 2021-02-07 NOTE — Discharge Instructions (Signed)

## 2021-02-11 ENCOUNTER — Encounter
Admission: RE | Disposition: A | Discharge status: Home / Self Care | Payer: Self-pay | Source: Home / Self Care | Attending: Ophthalmology

## 2021-02-11 ENCOUNTER — Ambulatory Visit: Payer: Medicare Other | Admitting: Anesthesiology

## 2021-02-11 ENCOUNTER — Other Ambulatory Visit: Payer: Self-pay

## 2021-02-11 ENCOUNTER — Encounter: Payer: Self-pay | Admitting: Ophthalmology

## 2021-02-11 ENCOUNTER — Ambulatory Visit
Admission: RE | Admit: 2021-02-11 | Discharge: 2021-02-11 | Disposition: A | Discharge status: Home / Self Care | Payer: Medicare Other | Attending: Ophthalmology | Admitting: Ophthalmology

## 2021-02-11 DIAGNOSIS — H2512 Age-related nuclear cataract, left eye: Secondary | ICD-10-CM | POA: Diagnosis present

## 2021-02-11 DIAGNOSIS — E1136 Type 2 diabetes mellitus with diabetic cataract: Secondary | ICD-10-CM | POA: Diagnosis not present

## 2021-02-11 DIAGNOSIS — Z7984 Long term (current) use of oral hypoglycemic drugs: Secondary | ICD-10-CM | POA: Insufficient documentation

## 2021-02-11 DIAGNOSIS — N183 Chronic kidney disease, stage 3 unspecified: Secondary | ICD-10-CM | POA: Insufficient documentation

## 2021-02-11 DIAGNOSIS — I129 Hypertensive chronic kidney disease with stage 1 through stage 4 chronic kidney disease, or unspecified chronic kidney disease: Secondary | ICD-10-CM | POA: Diagnosis not present

## 2021-02-11 DIAGNOSIS — Z79899 Other long term (current) drug therapy: Secondary | ICD-10-CM | POA: Insufficient documentation

## 2021-02-11 DIAGNOSIS — Z9841 Cataract extraction status, right eye: Secondary | ICD-10-CM | POA: Diagnosis present

## 2021-02-11 DIAGNOSIS — E1122 Type 2 diabetes mellitus with diabetic chronic kidney disease: Secondary | ICD-10-CM | POA: Diagnosis not present

## 2021-02-11 DIAGNOSIS — Z7982 Long term (current) use of aspirin: Secondary | ICD-10-CM | POA: Diagnosis not present

## 2021-02-11 DIAGNOSIS — Z791 Long term (current) use of non-steroidal anti-inflammatories (NSAID): Secondary | ICD-10-CM | POA: Insufficient documentation

## 2021-02-11 DIAGNOSIS — Z951 Presence of aortocoronary bypass graft: Secondary | ICD-10-CM | POA: Diagnosis not present

## 2021-02-11 HISTORY — PX: CATARACT EXTRACTION W/PHACO: SHX586

## 2021-02-11 LAB — GLUCOSE, CAPILLARY: Glucose-Capillary: 103 mg/dL — ABNORMAL HIGH (ref 70–99)

## 2021-02-11 SURGERY — PHACOEMULSIFICATION, CATARACT, WITH IOL INSERTION
Anesthesia: Monitor Anesthesia Care | Site: Eye | Laterality: Left

## 2021-02-11 MED ORDER — ONDANSETRON HCL 4 MG/2ML IJ SOLN: 4.0000 mg | Freq: Once | INTRAMUSCULAR | Status: DC | PRN

## 2021-02-11 MED ORDER — ACETAMINOPHEN 160 MG/5ML PO SOLN: 325.0000 mg | ORAL | Status: DC | PRN

## 2021-02-11 MED ORDER — EPINEPHRINE PF 1 MG/ML IJ SOLN
INTRAOCULAR | Status: DC | PRN
  Administered 2021-02-11: 87 mL via OPHTHALMIC

## 2021-02-11 MED ORDER — ARMC OPHTHALMIC DILATING DROPS
1.0000 "application " | OPHTHALMIC | Status: DC | PRN
  Administered 2021-02-11 (×3): 1 via OPHTHALMIC

## 2021-02-11 MED ORDER — TETRACAINE HCL 0.5 % OP SOLN
1.0000 [drp] | OPHTHALMIC | Status: DC | PRN
  Administered 2021-02-11 (×3): 1 [drp] via OPHTHALMIC

## 2021-02-11 MED ORDER — FENTANYL CITRATE (PF) 100 MCG/2ML IJ SOLN
INTRAMUSCULAR | Status: DC | PRN
  Administered 2021-02-11: 50 ug via INTRAVENOUS

## 2021-02-11 MED ORDER — LIDOCAINE HCL (PF) 2 % IJ SOLN: INTRAOCULAR | Status: DC | PRN

## 2021-02-11 MED ORDER — MOXIFLOXACIN HCL 0.5 % OP SOLN
OPHTHALMIC | Status: DC | PRN
  Administered 2021-02-11: 0.2 mL via OPHTHALMIC

## 2021-02-11 MED ORDER — LACTATED RINGERS IV SOLN: INTRAVENOUS | Status: DC

## 2021-02-11 MED ORDER — ACETAMINOPHEN 325 MG PO TABS: 325.0000 mg | ORAL_TABLET | ORAL | Status: DC | PRN

## 2021-02-11 MED ORDER — SODIUM HYALURONATE 23 MG/ML IO SOLN
INTRAOCULAR | Status: DC | PRN
  Administered 2021-02-11: 0.6 mL via INTRAOCULAR

## 2021-02-11 MED ORDER — SODIUM HYALURONATE 10 MG/ML IO SOLN
INTRAOCULAR | Status: DC | PRN
  Administered 2021-02-11: 0.55 mL via INTRAOCULAR

## 2021-02-11 MED ORDER — MIDAZOLAM HCL 2 MG/2ML IJ SOLN
INTRAMUSCULAR | Status: DC | PRN
  Administered 2021-02-11: 1 mg via INTRAVENOUS

## 2021-02-11 SURGICAL SUPPLY — 17 items
CANNULA ANT/CHMB 27GA (MISCELLANEOUS) ×4 IMPLANT
DISSECTOR HYDRO NUCLEUS 50X22 (MISCELLANEOUS) ×2 IMPLANT
GLOVE SURG SYN 8.5  E (GLOVE) ×3
GLOVE SURG SYN 8.5 E (GLOVE) ×3 IMPLANT
GOWN STRL REUS W/ TWL LRG LVL3 (GOWN DISPOSABLE) ×2 IMPLANT
GOWN STRL REUS W/TWL LRG LVL3 (GOWN DISPOSABLE) ×4
LENS IOL IQ PAN TRC 40 24.0 ×1 IMPLANT
LENS IOL PANOP TORIC 40 24.0 ×1 IMPLANT
LENS IOL PANOPTIX TORIC 24.0 ×2 IMPLANT
MARKER SKIN DUAL TIP RULER LAB (MISCELLANEOUS) ×2 IMPLANT
PACK DR. KING ARMS (PACKS) ×2 IMPLANT
PACK EYE AFTER SURG (MISCELLANEOUS) ×2 IMPLANT
PACK OPTHALMIC (MISCELLANEOUS) ×2 IMPLANT
SYR 3ML LL SCALE MARK (SYRINGE) ×2 IMPLANT
SYR TB 1ML LUER SLIP (SYRINGE) ×2 IMPLANT
WATER STERILE IRR 250ML POUR (IV SOLUTION) ×2 IMPLANT
WIPE NON LINTING 3.25X3.25 (MISCELLANEOUS) ×2 IMPLANT

## 2021-02-11 NOTE — Op Note (Signed)
OPERATIVE NOTE  Larry Ware [id number] 02/11/2021   PREOPERATIVE DIAGNOSIS:  Nuclear sclerotic cataract left eye.  H25.12   POSTOPERATIVE DIAGNOSIS:    Nuclear sclerotic cataract left eye.     PROCEDURE:  Phacoemusification with posterior chamber intraocular lens placement of the left eye   LENS:   Implant Name Type Inv. Item Serial No. Manufacturer Lot No. LRB No. Used Action  LENS IOL PANOPTIX TORIC 24.0 - [id number]  LENS IOL PANOPTIX TORIC 24.0 [id number] ALCON  Left 1 Implanted      Procedure(s) with comments: CATARACT EXTRACTION PHACO AND INTRAOCULAR LENS PLACEMENT (IOC) LEFT DIABETIC PANOPTIX LENS (Left) - 3.65 0:28.4  TFNT40 +24.0 rotated to 003 degrees   ULTRASOUND TIME: 0 minutes 28 seconds.  CDE 3.65   SURGEON:  Willey Blade, MD, MPH   ANESTHESIA:  Topical with tetracaine drops augmented with 1% preservative-free intracameral lidocaine.  ESTIMATED BLOOD LOSS: <1 mL   COMPLICATIONS:  None.   DESCRIPTION OF PROCEDURE:  The patient was identified in the holding room and transported to the operating room and placed in the supine position under the operating microscope.  The left eye was identified as the operative eye and it was prepped and draped in the usual sterile ophthalmic fashion.  The verion system was registered without difficulty.   A 1.0 millimeter clear-corneal paracentesis was made at the 5:00 position. 0.5 ml of preservative-free 1% lidocaine with epinephrine was injected into the anterior chamber.  The anterior chamber was filled with Healon 5 viscoelastic.  A 2.4 millimeter keratome was used to make a near-clear corneal incision at the 2:00 position.  A curvilinear capsulorrhexis was made with a cystotome and capsulorrhexis forceps.  Balanced salt solution was used to hydrodissect and hydrodelineate the nucleus.   Phacoemulsification was then used in stop and chop fashion to remove the lens nucleus and epinucleus.  The remaining cortex was then  removed using the irrigation and aspiration handpiece. Healon was then placed into the capsular bag to distend it for lens placement.  A lens was then injected into the capsular bag.  The remaining viscoelastic was aspirated.  The lens was rotated with guidance from the verion system.  The lens was well centered.  A photo   Wounds were hydrated with balanced salt solution.  The anterior chamber was inflated to a physiologic pressure with balanced salt solution.  Intracameral vigamox 0.1 mL undiltued was injected into the eye and a drop placed onto the ocular surface.  No wound leaks were noted.  The patient was taken to the recovery room in stable condition without complications of anesthesia or surgery  Willey Blade 02/11/2021, 9:47 AM

## 2021-02-11 NOTE — H&P (Signed)
[facility]   Primary Care Physician:  Dione Housekeeper, MD Ophthalmologist: Dr. Willey Blade  Pre-Procedure History & Physical: HPI:  Larry Ware is a 80 y.o. male here for cataract surgery.   Past Medical History:  Diagnosis Date  . Asthma   . 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 Diabetes type 2, controlled (HCC)   . Hypertension   . Pre-diabetes   . Wears dentures    partial lower    Past Surgical History:  Procedure Laterality Date  . CATARACT EXTRACTION W/PHACO Right 01/28/2021   Procedure: CATARACT EXTRACTION PHACO AND INTRAOCULAR LENS PLACEMENT (IOC) RIGHT DIABETIC PANOPTIX LENS 4.48 00:30.9;  Surgeon: Nevada Crane, MD;  Location: [facility];  Service: Ophthalmology;  Laterality: Right;  . 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 Authorizing Provider  albuterol (VENTOLIN HFA) 108 (90 Base) MCG/ACT inhaler Inhale into the lungs every 6 (six) hours as needed for wheezing or shortness of breath.   Yes [provider]  aspirin EC 81 MG tablet Take 81 mg by mouth daily.   Yes [provider]  atorvastatin (LIPITOR) 20 MG tablet Take 20 mg by mouth daily.   Yes [provider]  celecoxib (CELEBREX) 100 MG capsule Take 100 mg by mouth daily.   Yes [provider]  cholecalciferol (VITAMIN D) 1000 units tablet Take 1,000 Units by mouth daily.   Yes [provider]  ipratropium-albuterol (DUONEB) 0.5-2.5 (3) MG/3ML SOLN Take 3 mLs by nebulization.   Yes [provider]  isosorbide mononitrate (IMDUR) 30 MG 24 hr tablet Take 30 mg by mouth daily.   Yes [provider]  lisinopril (PRINIVIL,ZESTRIL) 20 MG tablet Take 20 mg by mouth daily.   Yes [provider]  metFORMIN (GLUCOPHAGE) 500 MG tablet Take by mouth 2 (two) times daily with a meal.   Yes  [provider]  metoprolol succinate (TOPROL-XL) 50 MG 24 hr tablet Take 25 mg by mouth in the morning and at bedtime. Take with or immediately following a meal.   Yes [provider]  Omega-3 Fatty Acids (FISH OIL) 1000 MG CAPS Take by mouth.   Yes [provider]  POTASSIUM PO Take by mouth daily.   Yes [provider]  vitamin B-12 (CYANOCOBALAMIN) 1000 MCG tablet Take 1,000 mcg by mouth daily.   Yes [provider]  vitamin C (ASCORBIC ACID) 500 MG tablet Take 500 mg by mouth daily.   Yes [provider]  nitroGLYCERIN (NITROSTAT) 0.4 MG SL tablet Place 0.4 mg under the tongue every 5 (five) minutes as needed for chest pain.    [provider]  oxyCODONE (OXY IR/ROXICODONE) 5 MG immediate release tablet Take 5 mg by mouth every 4 (four) hours as needed for severe pain.    [provider]    Allergies as of 12/12/2020  . (No Known Allergies)    History reviewed. No pertinent family history.  Social History   Socioeconomic History  . Marital status: Married    Spouse name: Not on file  . Number of children: Not on file  . Years of education: Not on file  . Highest education level: Not on file  Occupational History  . Not on file  Tobacco Use  . Smoking status: Never Smoker  . Smokeless tobacco: Never  Used  Vaping Use  . Vaping Use: Never used  Substance and Sexual Activity  . Alcohol use: No  . Drug use: No  . Sexual activity: Not on file  Other Topics Concern  . Not on file  Social History Narrative  . Not on file   Social Determinants of Health   Financial Resource Strain: Not on file  Food Insecurity: Not on file  Transportation Needs: Not on file  Physical Activity: Not on file  Stress: Not on file  Social Connections: Not on file  Intimate Partner Violence: Not on file    Review of Systems: See HPI, otherwise negative ROS  Physical Exam: BP (!) 172/61   Pulse (!) 49   Temp (!) 97  F (36.1 C) (Temporal)   Ht 5\' 9"  (1.753 m)   Wt 90.3 kg   SpO2 98%   BMI 29.39 kg/m  General:   Alert,  pleasant and cooperative in NAD Head:  Normocephalic and atraumatic. Respiratory:  Normal work of breathing. Cardiovascular:  RRR  Impression/Plan: is here for cataract surgery.  Risks, benefits, limitations, and alternatives regarding cataract surgery have been reviewed with the patient.  Questions have been answered.  All parties agreeable.   [name], MD  02/11/2021, 9:12 AM

## 2021-02-11 NOTE — H&P (Deleted)
[facility]   Primary Care Physician:  Dione Housekeeper, MD Ophthalmologist: Dr. Willey Blade  Pre-Procedure History & Physical: HPI:  Larry Ware is a 80 y.o. male here for cataract surgery.   Past Medical History:  Diagnosis Date  . Asthma   . 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 Diabetes type 2, controlled (HCC)   . Hypertension   . Pre-diabetes   . Wears dentures    partial lower    Past Surgical History:  Procedure Laterality Date  . CATARACT EXTRACTION W/PHACO Right 01/28/2021   Procedure: CATARACT EXTRACTION PHACO AND INTRAOCULAR LENS PLACEMENT (IOC) RIGHT DIABETIC PANOPTIX LENS 4.48 00:30.9;  Surgeon: Nevada Crane, MD;  Location: [facility];  Service: Ophthalmology;  Laterality: Right;  . 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 Authorizing Provider  albuterol (VENTOLIN HFA) 108 (90 Base) MCG/ACT inhaler Inhale into the lungs every 6 (six) hours as needed for wheezing or shortness of breath.   Yes [provider]  aspirin EC 81 MG tablet Take 81 mg by mouth daily.   Yes [provider]  atorvastatin (LIPITOR) 20 MG tablet Take 20 mg by mouth daily.   Yes [provider]  celecoxib (CELEBREX) 100 MG capsule Take 100 mg by mouth daily.   Yes [provider]  cholecalciferol (VITAMIN D) 1000 units tablet Take 1,000 Units by mouth daily.   Yes [provider]  ipratropium-albuterol (DUONEB) 0.5-2.5 (3) MG/3ML SOLN Take 3 mLs by nebulization.   Yes [provider]  isosorbide mononitrate (IMDUR) 30 MG 24 hr tablet Take 30 mg by mouth daily.   Yes [provider]  lisinopril (PRINIVIL,ZESTRIL) 20 MG tablet Take 20 mg by mouth daily.   Yes [provider]  metFORMIN (GLUCOPHAGE) 500 MG tablet Take by mouth 2 (two) times daily with a meal.   Yes  [provider]  metoprolol succinate (TOPROL-XL) 50 MG 24 hr tablet Take 25 mg by mouth in the morning and at bedtime. Take with or immediately following a meal.   Yes [provider]  Omega-3 Fatty Acids (FISH OIL) 1000 MG CAPS Take by mouth.   Yes [provider]  POTASSIUM PO Take by mouth daily.   Yes [provider]  vitamin B-12 (CYANOCOBALAMIN) 1000 MCG tablet Take 1,000 mcg by mouth daily.   Yes [provider]  vitamin C (ASCORBIC ACID) 500 MG tablet Take 500 mg by mouth daily.   Yes [provider]  nitroGLYCERIN (NITROSTAT) 0.4 MG SL tablet Place 0.4 mg under the tongue every 5 (five) minutes as needed for chest pain.    [provider]  oxyCODONE (OXY IR/ROXICODONE) 5 MG immediate release tablet Take 5 mg by mouth every 4 (four) hours as needed for severe pain.    [provider]    Allergies as of 12/12/2020  . (No Known Allergies)    History reviewed. No pertinent family history.  Social History   Socioeconomic History  . Marital status: Married    Spouse name: Not on file  . Number of children: Not on file  . Years of education: Not on file  . Highest education level: Not on file  Occupational History  . Not on file  Tobacco Use  . Smoking status: Never Smoker  . Smokeless tobacco: Never  Used  Vaping Use  . Vaping Use: Never used  Substance and Sexual Activity  . Alcohol use: No  . Drug use: No  . Sexual activity: Not on file  Other Topics Concern  . Not on file  Social History Narrative  . Not on file   Social Determinants of Health   Financial Resource Strain: Not on file  Food Insecurity: Not on file  Transportation Needs: Not on file  Physical Activity: Not on file  Stress: Not on file  Social Connections: Not on file  Intimate Partner Violence: Not on file    Review of Systems: See HPI, otherwise negative ROS  Physical Exam: BP (!) 172/61   Pulse (!) 49   Temp (!) 97  F (36.1 C) (Temporal)   Ht 5\' 9"  (1.753 m)   Wt 90.3 kg   SpO2 98%   BMI 29.39 kg/m  General:   Alert,  pleasant and cooperative in NAD Head:  Normocephalic and atraumatic. Respiratory:  Normal work of breathing. Cardiovascular:  RRR  Impression/Plan: is here for cataract surgery.  Risks, benefits, limitations, and alternatives regarding cataract surgery have been reviewed with the patient.  Questions have been answered.  All parties agreeable.   [name], MD  02/11/2021, 9:46 AM

## 2021-02-11 NOTE — Anesthesia Postprocedure Evaluation (Signed)
Anesthesia Post Note  Patient: Larry Ware  Procedure(s) Performed: CATARACT EXTRACTION PHACO AND INTRAOCULAR LENS PLACEMENT (IOC) LEFT DIABETIC PANOPTIX LENS (Left Eye)     Patient location during evaluation: PACU Anesthesia Type: MAC Level of consciousness: awake and alert Pain management: pain level controlled Vital Signs Assessment: post-procedure vital signs reviewed and stable Respiratory status: spontaneous breathing, nonlabored ventilation, respiratory function stable and patient connected to nasal cannula oxygen Cardiovascular status: stable and blood pressure returned to baseline Postop Assessment: no apparent nausea or vomiting Anesthetic complications: no   No complications documented.  Sinda Du

## 2021-02-11 NOTE — Transfer of Care (Signed)
Immediate Anesthesia Transfer of Care Note  Patient: Larry Ware  Procedure(s) Performed: CATARACT EXTRACTION PHACO AND INTRAOCULAR LENS PLACEMENT (IOC) LEFT DIABETIC PANOPTIX LENS (Left Eye)  Patient Location: PACU  Anesthesia Type: MAC  Level of Consciousness: awake, alert  and patient cooperative  Airway and Oxygen Therapy: Patient Spontanous Breathing and Patient connected to supplemental oxygen  Post-op Assessment: Post-op Vital signs reviewed, Patient's Cardiovascular Status Stable, Respiratory Function Stable, Patent Airway and No signs of Nausea or vomiting  Post-op Vital Signs: Reviewed and stable  Complications: No complications documented.

## 2021-02-11 NOTE — Anesthesia Preprocedure Evaluation (Signed)
Anesthesia Evaluation  Patient identified by MRN, date of birth, ID band Patient awake    Reviewed: Allergy & Precautions, NPO status   Airway Mallampati: II  TM Distance: >3 FB     Dental no notable dental hx.    Pulmonary asthma ,    Pulmonary exam normal breath sounds clear to auscultation       Cardiovascular Exercise Tolerance: Good hypertension, + CABG   Rhythm:Regular Rate:Normal  HLD   Neuro/Psych    GI/Hepatic   Endo/Other  diabetes, Type 2, Oral Hypoglycemic Agents  Renal/GU Renal disease     Musculoskeletal   Abdominal Normal abdominal exam  (+) - obese,  Abdomen: soft.    Peds  Hematology   Anesthesia Other Findings   Reproductive/Obstetrics                             Anesthesia Physical  Anesthesia Plan  ASA: III  Anesthesia Plan: MAC   Post-op Pain Management:    Induction: Intravenous  PONV Risk Score and Plan: TIVA, Midazolam and Treatment may vary due to age or medical condition  Airway Management Planned: Natural Airway and Nasal Cannula  Additional Equipment:   Intra-op Plan:   Post-operative Plan:   Informed Consent: I have reviewed the patients History and Physical, chart, labs and discussed the procedure including the risks, benefits and alternatives for the proposed anesthesia with the patient or authorized representative who has indicated his/her understanding and acceptance.     Dental advisory given  Plan Discussed with: CRNA and Anesthesiologist  Anesthesia Plan Comments:         Anesthesia Quick Evaluation   There are no problems to display for this patient.   No flowsheet data found. No flowsheet data found.  Risks and benefits of anesthesia discussed at length, patient or surrogate demonstrates understanding. Appropriately NPO. Plan to proceed with anesthesia.  Champ Mungo, MD 02/11/21

## 2021-02-11 NOTE — Anesthesia Procedure Notes (Signed)
Procedure Name: MAC Date/Time: 02/11/2021 9:22 AM Performed by: Cameron Ali, CRNA Pre-anesthesia Checklist: Patient identified, Emergency Drugs available, Suction available, Timeout performed and Patient being monitored Patient Re-evaluated:Patient Re-evaluated prior to induction Oxygen Delivery Method: Nasal cannula Placement Confirmation: positive ETCO2

## 2021-02-12 ENCOUNTER — Encounter: Payer: Self-pay | Admitting: Ophthalmology
